# Patient Record
Sex: Male | Born: 2000 | Race: White | Hispanic: No | Marital: Single | Smoking: Current every day smoker
Health system: Southern US, Community
[De-identification: ages and names within clinical notes are randomized; demographics above are authoritative.]

## PROBLEM LIST (undated history)

## (undated) HISTORY — PX: TONSILLECTOMY: SUR1361

---
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
## 2022-03-08 DIAGNOSIS — J0391 Acute recurrent tonsillitis, unspecified: Secondary | ICD-10-CM | POA: Insufficient documentation

## 2022-03-16 ENCOUNTER — Other Ambulatory Visit (HOSPITAL_COMMUNITY): Payer: Self-pay

## 2022-03-16 MED ORDER — HYDROCODONE-ACETAMINOPHEN 7.5-325 MG/15ML PO SOLN
15.0000 mL | Freq: Four times a day (QID) | ORAL | 0 refills | Status: DC | PRN
  Filled 2022-03-16: qty 420, 7d supply, fill #0

## 2022-03-24 ENCOUNTER — Emergency Department (EMERGENCY_DEPARTMENT_HOSPITAL): Payer: Medicaid Other | Admitting: Anesthesiology

## 2022-03-24 ENCOUNTER — Other Ambulatory Visit: Payer: Self-pay

## 2022-03-24 ENCOUNTER — Ambulatory Visit: Payer: Self-pay | Admitting: Otolaryngology

## 2022-03-24 ENCOUNTER — Emergency Department (HOSPITAL_COMMUNITY): Payer: Medicaid Other | Admitting: Anesthesiology

## 2022-03-24 ENCOUNTER — Encounter (HOSPITAL_COMMUNITY): Payer: Self-pay

## 2022-03-24 ENCOUNTER — Encounter (HOSPITAL_COMMUNITY)
Admission: EM | Disposition: A | Discharge status: Home / Self Care | Payer: Self-pay | Source: Home / Self Care | Attending: Emergency Medicine

## 2022-03-24 ENCOUNTER — Ambulatory Visit (HOSPITAL_COMMUNITY)
Admission: EM | Admit: 2022-03-24 | Discharge: 2022-03-24 | Disposition: A | Discharge status: Home / Self Care | Payer: Medicaid Other | Attending: Emergency Medicine | Admitting: Emergency Medicine

## 2022-03-24 DIAGNOSIS — J95830 Postprocedural hemorrhage and hematoma of a respiratory system organ or structure following a respiratory system procedure: Secondary | ICD-10-CM

## 2022-03-24 HISTORY — PX: TONSILLECTOMY: SHX5217

## 2022-03-24 LAB — CBC WITH DIFFERENTIAL/PLATELET
Abs Immature Granulocytes: 0.01 10*3/uL (ref 0.00–0.07)
Basophils Absolute: 0 10*3/uL (ref 0.0–0.1)
Basophils Relative: 1 %
Eosinophils Absolute: 0.6 10*3/uL — ABNORMAL HIGH (ref 0.0–0.5)
Eosinophils Relative: 9 %
HCT: 42.2 % (ref 39.0–52.0)
Hemoglobin: 14.3 g/dL (ref 13.0–17.0)
Immature Granulocytes: 0 %
Lymphocytes Relative: 32 %
Lymphs Abs: 2.1 10*3/uL (ref 0.7–4.0)
MCH: 28.8 pg (ref 26.0–34.0)
MCHC: 33.9 g/dL (ref 30.0–36.0)
MCV: 84.9 fL (ref 80.0–100.0)
Monocytes Absolute: 0.5 10*3/uL (ref 0.1–1.0)
Monocytes Relative: 8 %
Neutro Abs: 3.4 10*3/uL (ref 1.7–7.7)
Neutrophils Relative %: 50 %
Platelets: 173 10*3/uL (ref 150–400)
RBC: 4.97 MIL/uL (ref 4.22–5.81)
RDW: 12 % (ref 11.5–15.5)
WBC: 6.7 10*3/uL (ref 4.0–10.5)
nRBC: 0 % (ref 0.0–0.2)

## 2022-03-24 LAB — BASIC METABOLIC PANEL
Anion gap: 8 (ref 5–15)
BUN: 19 mg/dL (ref 6–20)
CO2: 28 mmol/L (ref 22–32)
Calcium: 8.6 mg/dL — ABNORMAL LOW (ref 8.9–10.3)
Chloride: 101 mmol/L (ref 98–111)
Creatinine, Ser: 1.02 mg/dL (ref 0.61–1.24)
GFR, Estimated: >60 mL/min (ref >60)
Glucose, Bld: 96 mg/dL (ref 70–99)
Potassium: 3.8 mmol/L (ref 3.5–5.1)
Sodium: 137 mmol/L (ref 135–145)

## 2022-03-24 SURGERY — TONSILLECTOMY
Anesthesia: General

## 2022-03-24 MED ORDER — ORAL CARE MOUTH RINSE: 15.0000 mL | Freq: Once | OROMUCOSAL | Status: DC

## 2022-03-24 MED ORDER — SUCCINYLCHOLINE CHLORIDE 200 MG/10ML IV SOSY
PREFILLED_SYRINGE | INTRAVENOUS | Status: DC | PRN
  Administered 2022-03-24: 60 mg via INTRAVENOUS

## 2022-03-24 MED ORDER — OXYCODONE HCL 5 MG PO TABS: 5.0000 mg | ORAL_TABLET | Freq: Once | ORAL | Status: DC | PRN

## 2022-03-24 MED ORDER — SODIUM CHLORIDE 0.9 % IR SOLN
Status: DC | PRN
  Administered 2022-03-24: 1000 mL

## 2022-03-24 MED ORDER — FENTANYL CITRATE (PF) 250 MCG/5ML IJ SOLN
INTRAMUSCULAR | Status: DC | PRN
  Administered 2022-03-24: 50 ug via INTRAVENOUS

## 2022-03-24 MED ORDER — FENTANYL CITRATE (PF) 100 MCG/2ML IJ SOLN: 25.0000 ug | INTRAMUSCULAR | Status: DC | PRN

## 2022-03-24 MED ORDER — LACTATED RINGERS IV SOLN: INTRAVENOUS | Status: DC

## 2022-03-24 MED ORDER — MIDAZOLAM HCL 2 MG/2ML IJ SOLN
INTRAMUSCULAR | Status: AC
  Filled 2022-03-24: qty 2

## 2022-03-24 MED ORDER — PROMETHAZINE HCL 25 MG/ML IJ SOLN: 6.2500 mg | INTRAMUSCULAR | Status: DC | PRN

## 2022-03-24 MED ORDER — PROPOFOL 10 MG/ML IV BOLUS
INTRAVENOUS | Status: DC | PRN
  Administered 2022-03-24: 150 mg via INTRAVENOUS

## 2022-03-24 MED ORDER — MIDAZOLAM HCL 5 MG/5ML IJ SOLN
INTRAMUSCULAR | Status: DC | PRN
  Administered 2022-03-24: 2 mg via INTRAVENOUS

## 2022-03-24 MED ORDER — TRANEXAMIC ACID FOR EPISTAXIS
500.0000 mg | Freq: Once | TOPICAL | Status: AC
  Administered 2022-03-24: 500 mg via TOPICAL
  Filled 2022-03-24: qty 10

## 2022-03-24 MED ORDER — DEXAMETHASONE SODIUM PHOSPHATE 10 MG/ML IJ SOLN
INTRAMUSCULAR | Status: AC
  Filled 2022-03-24: qty 1

## 2022-03-24 MED ORDER — AMISULPRIDE (ANTIEMETIC) 5 MG/2ML IV SOLN: 10.0000 mg | Freq: Once | INTRAVENOUS | Status: DC | PRN

## 2022-03-24 MED ORDER — ONDANSETRON HCL 4 MG/2ML IJ SOLN
INTRAMUSCULAR | Status: AC
  Filled 2022-03-24: qty 2

## 2022-03-24 MED ORDER — LACTATED RINGERS IV BOLUS
1000.0000 mL | Freq: Once | INTRAVENOUS | Status: AC
  Administered 2022-03-24: 1000 mL via INTRAVENOUS

## 2022-03-24 MED ORDER — SUCCINYLCHOLINE CHLORIDE 200 MG/10ML IV SOSY
PREFILLED_SYRINGE | INTRAVENOUS | Status: AC
  Filled 2022-03-24: qty 10

## 2022-03-24 MED ORDER — OXYCODONE HCL 5 MG/5ML PO SOLN: 5.0000 mg | Freq: Once | ORAL | Status: DC | PRN

## 2022-03-24 MED ORDER — ACETAMINOPHEN 10 MG/ML IV SOLN: 1000.0000 mg | Freq: Once | INTRAVENOUS | Status: DC | PRN

## 2022-03-24 MED ORDER — OXYMETAZOLINE HCL 0.05 % NA SOLN
NASAL | Status: AC
  Filled 2022-03-24: qty 30

## 2022-03-24 MED ORDER — LIDOCAINE 2% (20 MG/ML) 5 ML SYRINGE
INTRAMUSCULAR | Status: DC | PRN
  Administered 2022-03-24: 50 mg via INTRAVENOUS

## 2022-03-24 MED ORDER — PROPOFOL 10 MG/ML IV BOLUS
INTRAVENOUS | Status: AC
  Filled 2022-03-24: qty 20

## 2022-03-24 MED ORDER — FENTANYL CITRATE (PF) 250 MCG/5ML IJ SOLN
INTRAMUSCULAR | Status: AC
  Filled 2022-03-24: qty 5

## 2022-03-24 MED ORDER — CHLORHEXIDINE GLUCONATE 0.12 % MT SOLN: 15.0000 mL | Freq: Once | OROMUCOSAL | Status: DC

## 2022-03-24 MED ORDER — ONDANSETRON HCL 4 MG/2ML IJ SOLN
INTRAMUSCULAR | Status: DC | PRN
  Administered 2022-03-24: 4 mg via INTRAVENOUS

## 2022-03-24 MED ORDER — DEXAMETHASONE SODIUM PHOSPHATE 10 MG/ML IJ SOLN
INTRAMUSCULAR | Status: DC | PRN
  Administered 2022-03-24: 10 mg via INTRAVENOUS

## 2022-03-24 SURGICAL SUPPLY — 34 items
BAG COUNTER SPONGE SURGICOUNT (BAG) ×1 IMPLANT
BLADE SURG 15 STRL LF DISP TIS (BLADE) IMPLANT
BLADE SURG 15 STRL SS (BLADE)
CANISTER SUCT 3000ML PPV (MISCELLANEOUS) ×1 IMPLANT
CATH ROBINSON RED A/P 10FR (CATHETERS) ×1 IMPLANT
CLEANER TIP ELECTROSURG 2X2 (MISCELLANEOUS) ×1 IMPLANT
COAGULATOR SUCT SWTCH 10FR 6 (ELECTROSURGICAL) ×1 IMPLANT
DRAPE HALF SHEET 40X57 (DRAPES) IMPLANT
ELECT COATED BLADE 2.86 ST (ELECTRODE) ×1 IMPLANT
ELECT REM PT RETURN 9FT ADLT (ELECTROSURGICAL)
ELECT REM PT RETURN 9FT PED (ELECTROSURGICAL)
ELECTRODE REM PT RETRN 9FT PED (ELECTROSURGICAL) IMPLANT
ELECTRODE REM PT RTRN 9FT ADLT (ELECTROSURGICAL) IMPLANT
GAUZE 4X4 16PLY ~~LOC~~+RFID DBL (SPONGE) ×1 IMPLANT
GLOVE ECLIPSE 7.5 STRL STRAW (GLOVE) ×1 IMPLANT
GOWN STRL REUS W/ TWL LRG LVL3 (GOWN DISPOSABLE) ×2 IMPLANT
GOWN STRL REUS W/TWL LRG LVL3 (GOWN DISPOSABLE) ×2
KIT BASIN OR (CUSTOM PROCEDURE TRAY) ×1 IMPLANT
KIT TURNOVER KIT B (KITS) ×1 IMPLANT
NS IRRIG 1000ML POUR BTL (IV SOLUTION) ×1 IMPLANT
PACK BASIC III (CUSTOM PROCEDURE TRAY) ×1
PACK SRG BSC III STRL LF ECLPS (CUSTOM PROCEDURE TRAY) ×1 IMPLANT
PAD ARMBOARD 7.5X6 YLW CONV (MISCELLANEOUS) ×2 IMPLANT
PENCIL FOOT CONTROL (ELECTRODE) ×1 IMPLANT
SPECIMEN JAR SMALL (MISCELLANEOUS) ×2 IMPLANT
SPONGE TONSIL 1 RF SGL (DISPOSABLE) ×1 IMPLANT
SYR BULB EAR ULCER 3OZ GRN STR (SYRINGE) ×1 IMPLANT
TOWEL GREEN STERILE FF (TOWEL DISPOSABLE) ×2 IMPLANT
TUBE CONNECTING 12X1/4 (SUCTIONS) ×1 IMPLANT
TUBE SALEM SUMP 10F W/ARV (TUBING) IMPLANT
TUBE SALEM SUMP 12R W/ARV (TUBING) IMPLANT
TUBE SALEM SUMP 14F W/ARV (TUBING) IMPLANT
TUBE SALEM SUMP 16 FR W/ARV (TUBING) IMPLANT
WATER STERILE IRR 1000ML POUR (IV SOLUTION) ×1 IMPLANT

## 2022-03-24 NOTE — ED Notes (Signed)
[facility] arrived to transport patient to [facility], VS stable

## 2022-03-24 NOTE — ED Provider Notes (Signed)
[facility] Provider Note   CSN: [id number] Arrival date & time: 03/24/22  0542     History  Chief Complaint  Patient presents with   Post-op Problem    Joseph Burke is a 22 y.o. male.  Patient presents to the emergency department for evaluation of bleeding from recent tonsillectomy.  Has had persistent bleeding since the surgery last week.  Reports that he has been told to drink ice water and at times it makes it slow down but then the bleeding starts again.       Home Medications Prior to Admission medications   Medication Sig Start Date End Date Taking? Authorizing Provider  naproxen (NAPROSYN) 500 MG tablet Take 1 tablet (500 mg total) by mouth 2 (two) times daily with a meal. 03/16/21   Jaynee Eagles, PA-C  ondansetron (ZOFRAN ODT) 4 MG disintegrating tablet Take 1 tablet (4 mg total) by mouth every 8 (eight) hours as needed for nausea or vomiting. 09/01/18   Henderly, Britni A, PA-C  phenol (CHLORASEPTIC) 1.4 % LIQD Use as directed 1 spray in the mouth or throat 3 (three) times daily as needed for throat irritation / pain. 03/16/21   Jaynee Eagles, PA-C      Allergies    Patient has no known allergies.    Review of Systems   Review of Systems  Physical Exam Updated Vital Signs BP 122/79 (BP Location: Left Arm)   Pulse 70   Temp 97.9 F (36.6 C) (Oral)   Resp 16   Ht '5\' 8"'$  (1.727 m)   Wt 56.7 kg   SpO2 100%   BMI 19.01 kg/m  Physical Exam Vitals and nursing note reviewed.  Constitutional:      General: He is not in acute distress.    Appearance: He is well-developed.  HENT:     Head: Normocephalic and atraumatic.     Mouth/Throat:     Mouth: Mucous membranes are moist.     Comments: Blood and clots in patient's mouth, bleeding from tonsillectomy bed Eyes:     General: Vision grossly intact. Gaze aligned appropriately.     Extraocular Movements: Extraocular movements intact.     Conjunctiva/sclera: Conjunctivae  normal.  Cardiovascular:     Rate and Rhythm: Normal rate and regular rhythm.     Pulses: Normal pulses.     Heart sounds: Normal heart sounds, S1 normal and S2 normal. No murmur heard.    No friction rub. No gallop.  Pulmonary:     Effort: Pulmonary effort is normal. No respiratory distress.     Breath sounds: Normal breath sounds.  Abdominal:     Palpations: Abdomen is soft.     Tenderness: There is no abdominal tenderness. There is no guarding or rebound.     Hernia: No hernia is present.  Musculoskeletal:        General: No swelling.     Cervical back: Full passive range of motion without pain, normal range of motion and neck supple. No pain with movement, spinous process tenderness or muscular tenderness. Normal range of motion.     Right lower leg: No edema.     Left lower leg: No edema.  Skin:    General: Skin is warm and dry.     Capillary Refill: Capillary refill takes less than 2 seconds.     Findings: No ecchymosis, erythema, lesion or wound.  Neurological:     Mental Status: He is alert and oriented to  person, place, and time.     GCS: GCS eye subscore is 4. GCS verbal subscore is 5. GCS motor subscore is 6.     Cranial Nerves: Cranial nerves 2-12 are intact.     Sensory: Sensation is intact.     Motor: Motor function is intact. No weakness or abnormal muscle tone.     Coordination: Coordination is intact.  Psychiatric:        Mood and Affect: Mood normal.        Speech: Speech normal.        Behavior: Behavior normal.     ED Results / Procedures / Treatments   Labs (all labs ordered are listed, but only abnormal results are displayed) Labs Reviewed  CBC WITH DIFFERENTIAL/PLATELET - Abnormal; Notable for the following components:      Result Value   Eosinophils Absolute 0.6 (*)    All other components within normal limits  BASIC METABOLIC PANEL - Abnormal; Notable for the following components:   Calcium 8.6 (*)    All other components within normal limits     EKG None  Radiology No results found.  Procedures Procedures    Medications Ordered in ED Medications  lactated ringers bolus 1,000 mL (1,000 mLs Intravenous New Bag/Given 03/24/22 0608)  tranexamic acid (CYKLOKAPRON) 1000 MG/10ML topical solution 500 mg (500 mg Topical Given 03/24/22 [id number])    ED Course/ Medical Decision Making/ A&P                             Medical Decision Making Amount and/or Complexity of Data Reviewed Labs: ordered.   Patient presents with post tonsillectomy bleeding.  It sounds like this has been on and off for days.  He is breathing without difficulty.  Hemoglobin is stable.  Attempted nebulized tranexamic acid.  Patient had difficulty because he continually needed to spit out the blood.  Contacted Dr. Constance Holster, on-call for ENT at [facility] [place].  Patient to be transferred to the ED at [facility] for him to evaluate and likely take to the OR.        Final Clinical Impression(s) / ED Diagnoses Final diagnoses:  Hemorrhage following tonsillectomy    Rx / DC Orders ED Discharge Orders     None         Orpah Greek, MD 03/24/22 [phone]

## 2022-03-24 NOTE — ED Triage Notes (Signed)
Pt arrived via [facility] from home c/o bleeding from his throat following recent tonsillectomy. Pt reports he was also recently exposed to Strep Throat. Pt actively spitting out bloody mucus. Pt reports going to other facilities this week to get treatment and reports nothing has helped. EDP at bedside during Triage.

## 2022-03-24 NOTE — Anesthesia Postprocedure Evaluation (Signed)
Anesthesia Post Note  Patient: [place] Klus  Procedure(s) Performed: POST OP TONSILLECTOMY     Patient location during evaluation: PACU Anesthesia Type: General Level of consciousness: awake Pain management: pain level controlled Vital Signs Assessment: post-procedure vital signs reviewed and stable Respiratory status: spontaneous breathing, nonlabored ventilation and respiratory function stable Cardiovascular status: blood pressure returned to baseline and stable Postop Assessment: no apparent nausea or vomiting Anesthetic complications: no   No notable events documented.  Last Vitals:  Vitals:   03/24/22 1315 03/24/22 1330  BP: 104/64 106/64  Pulse: (!) 48 (!) 52  Resp: 12 13  Temp:  36.8 C  SpO2: 98% 98%    Last Pain:  Vitals:   03/24/22 1330  TempSrc:   PainSc: 0-No pain                 Branda Chaudhary P Nikolas Casher

## 2022-03-24 NOTE — Anesthesia Preprocedure Evaluation (Signed)
Anesthesia Evaluation  Patient identified by MRN, date of birth, ID band Patient awake    Reviewed: Allergy & Precautions, NPO status , Patient's Chart, lab work & pertinent test results  Airway Mallampati: III  TM Distance: >3 FB Neck ROM: Full  Mouth opening: Limited Mouth Opening  Dental no notable dental hx.    Pulmonary neg pulmonary ROS   Pulmonary exam normal        Cardiovascular negative cardio ROS Normal cardiovascular exam     Neuro/Psych negative neurological ROS  negative psych ROS   GI/Hepatic negative GI ROS, Neg liver ROS,,,  Endo/Other  negative endocrine ROS    Renal/GU negative Renal ROS     Musculoskeletal negative musculoskeletal ROS (+)    Abdominal   Peds  Hematology negative hematology ROS (+)   Anesthesia Other Findings  Post tonsillectomy bleed  Reproductive/Obstetrics                             Anesthesia Physical Anesthesia Plan  ASA: 1  Anesthesia Plan: General   Post-op Pain Management:    Induction: Intravenous  PONV Risk Score and Plan: 2 and Ondansetron, Dexamethasone, Midazolam and Treatment may vary due to age or medical condition  Airway Management Planned: Oral ETT and Video Laryngoscope Planned  Additional Equipment:   Intra-op Plan:   Post-operative Plan: Extubation in OR  Informed Consent: I have reviewed the patients History and Physical, chart, labs and discussed the procedure including the risks, benefits and alternatives for the proposed anesthesia with the patient or authorized representative who has indicated his/her understanding and acceptance.     Dental advisory given  Plan Discussed with: CRNA  Anesthesia Plan Comments:        Anesthesia Quick Evaluation

## 2022-03-24 NOTE — H&P (Signed)
Joseph Burke is an 22 y.o. male.   Chief Complaint: Post tonsillectomy bleeding HPI: History of tonsillectomy 8 days ago at the outpatient center by Dr. Redmond Baseman.  On day 5 he started bleeding, and has had multiple episodes of bleeding since then.  He has been to an urgent care, and several other outlying facilities.  He returned again this morning to [facility].  Recommended transfer down to [facility] for surgical intervention.  Otherwise in good health.  Currently not bleeding.  He feels it is coming from the left side.  No past medical history on file.  Past Surgical History:  Procedure Laterality Date   TONSILLECTOMY      No family history on file. Social History:  reports that he has never smoked. He has been exposed to tobacco smoke. He has never used smokeless tobacco. He reports that he does not drink alcohol and does not use drugs.  Allergies: No Known Allergies  (Not in a hospital admission)   Results for orders placed or performed during the hospital encounter of 03/24/22 (from the past 48 hour(s))  CBC with Differential/Platelet     Status: Abnormal   Collection Time: 03/24/22  6:04 AM  Result Value Ref Range   WBC 6.7 4.0 - 10.5 K/uL   RBC 4.97 4.22 - 5.81 MIL/uL   Hemoglobin 14.3 13.0 - 17.0 g/dL   HCT 42.2 39.0 - 52.0 %   MCV 84.9 80.0 - 100.0 fL   MCH 28.8 26.0 - 34.0 pg   MCHC 33.9 30.0 - 36.0 g/dL   RDW 12.0 11.5 - 15.5 %   Platelets 173 150 - 400 K/uL   nRBC 0.0 0.0 - 0.2 %   Neutrophils Relative % 50 %   Neutro Abs 3.4 1.7 - 7.7 K/uL   Lymphocytes Relative 32 %   Lymphs Abs 2.1 0.7 - 4.0 K/uL   Monocytes Relative 8 %   Monocytes Absolute 0.5 0.1 - 1.0 K/uL   Eosinophils Relative 9 %   Eosinophils Absolute 0.6 (H) 0.0 - 0.5 K/uL   Basophils Relative 1 %   Basophils Absolute 0.0 0.0 - 0.1 K/uL   Immature Granulocytes 0 %   Abs Immature Granulocytes 0.01 0.00 - 0.07 K/uL    Comment: Performed at [facility], [address] [place], [place]  [id number]  Basic metabolic panel     Status: Abnormal   Collection Time: 03/24/22  6:04 AM  Result Value Ref Range   Sodium 137 135 - 145 mmol/L   Potassium 3.8 3.5 - 5.1 mmol/L   Chloride 101 98 - 111 mmol/L   CO2 28 22 - 32 mmol/L   Glucose, Bld 96 70 - 99 mg/dL    Comment: Glucose reference range applies only to samples taken after fasting for at least 8 hours.   BUN 19 6 - 20 mg/dL   Creatinine, Ser 1.02 0.61 - 1.24 mg/dL   Calcium 8.6 (L) 8.9 - 10.3 mg/dL   GFR, Estimated >60 >60 mL/min    Comment: (NOTE) Calculated using the CKD-EPI Creatinine Equation (2021)    Anion gap 8 5 - 15    Comment: Performed at [facility], [address], [place],  [address]   No results found.  ROS: otherwise negative  There were no vitals taken for this visit.  PHYSICAL EXAM: Overall appearance:  Healthy appearing, in no distress.  Breathing and voice are clear. Head:  Normocephalic, atraumatic. Ears: External ears look healthy. Nose: External nose  is healthy in appearance. Internal nasal exam free of any lesions or obstruction. Oral Cavity/pharynx: Bilateral tonsillar fossae with normal eschar, blood clot attached to the left side no active bleeding. Neuro:  No identifiable neurologic deficits. Neck: No palpable neck masses.  Studies Reviewed: none    Assessment/Plan Post tonsillectomy bleeding, clot on the left side.  Recommend exam under anesthesia with cauterization.  Risks and benefits discussed with him and the mother.  Should be able to go home later today.  Izora Gala 03/24/2022, 10:33 AM

## 2022-03-24 NOTE — Anesthesia Procedure Notes (Signed)
Procedure Name: Intubation Date/Time: 03/24/2022 12:07 PM  Performed by: Kyung Rudd, CRNAPre-anesthesia Checklist: Patient identified, Emergency Drugs available, Suction available and Patient being monitored Patient Re-evaluated:Patient Re-evaluated prior to induction Oxygen Delivery Method: Circle system utilized Preoxygenation: Pre-oxygenation with 100% oxygen Induction Type: IV induction and Rapid sequence Ventilation: Mask ventilation without difficulty Laryngoscope Size: Mac and 4 Grade View: Grade I Tube type: Oral Tube size: 7.0 mm Number of attempts: 1 Airway Equipment and Method: Stylet Placement Confirmation: ETT inserted through vocal cords under direct vision, positive ETCO2 and breath sounds checked- equal and bilateral Secured at: 21 cm Tube secured with: Tape Dental Injury: Teeth and Oropharynx as per pre-operative assessment

## 2022-03-24 NOTE — ED Provider Notes (Signed)
Patient seen on arrival as a transfer from our affiliated center.  I discussed his transport with critical care staff.  They note no hemodynamic instability in transport, patient was self suctioning, spitting up blood. The patient denies new complaints, pain, we discussed his recent history, and on exam he has evidence for clot left greater than right. ENT paged   Joseph Muskrat, MD 03/24/22 [phone]

## 2022-03-24 NOTE — Op Note (Signed)
03/24/2022  12:23 PM  PATIENT:  [place]  22 y.o. male  PRE-OPERATIVE DIAGNOSIS:  Post tonsillectomy hemorrhage  POST-OPERATIVE DIAGNOSIS:  Post tonsillectomy hemorrhage  PROCEDURE:  Procedure(s): Treatment of POST OP TONSILLECTOMY hemorrhage  SURGEON:  Surgeon(s): Izora Gala, MD  ANESTHESIA:   General  COUNTS: Correct   DICTATION: The patient was taken to the operating room and placed on the operating table in the supine position. Following induction of general endotracheal anesthesia, the table was turned and the patient was draped in a standard fashion. A Crowe-Davis mouthgag was inserted into the oral cavity and used to retract the tongue and mandible, then attached to the Mayo stand.  The pharynx was inspected and blood clot was adherent to the left tonsillar fossa.  This was all cleaned up.  There was extensive granulation tissue around the tonsillar fossa.  There was a main bleeding site in the midportion of the tonsillar fossa that was cauterized with suction cautery.  There were multiple other small areas of granulation tissue with mild bleeding that were similarly treated.  There was no bleeding on the right side.  The oral cavity and pharynx was irrigated with saline and suctioned..  An oral gastric tube was used to aspirate the contents of the stomach. The patient was then awakened from anesthesia and transferred to PACU in stable condition.   PATIENT DISPOSITION:  To PACU, stable

## 2022-03-24 NOTE — Transfer of Care (Signed)
Immediate Anesthesia Transfer of Care Note  Patient: Joseph Burke  Procedure(s) Performed: POST OP TONSILLECTOMY  Patient Location: PACU  Anesthesia Type:General  Level of Consciousness: awake, alert , and drowsy  Airway & Oxygen Therapy: Patient Spontanous Breathing  Post-op Assessment: Report given to RN, Post -op Vital signs reviewed and stable, and Patient moving all extremities X 4  Post vital signs: Reviewed and stable  Last Vitals:  Vitals Value Taken Time  BP 118/66 03/24/22 1240  Temp    Pulse 51 03/24/22 1245  Resp 15 03/24/22 1245  SpO2 99 % 03/24/22 1245  Vitals shown include unvalidated device data.  Last Pain:  Vitals:   03/24/22 1119  TempSrc: Oral  PainSc: 3          Complications: No notable events documented.

## 2022-03-24 NOTE — Discharge Instructions (Signed)
Resume diet, stick with soft foods for the next several days.  Stay well-hydrated.

## 2022-03-24 NOTE — ED Triage Notes (Signed)
Pt BIB [facility] from home c/o continued tonsil bleeding since having his tonsils removed 1 week ago.  Pt states he was seen at 2 other EDs recently for the same.

## 2022-03-25 ENCOUNTER — Encounter (HOSPITAL_COMMUNITY): Payer: Self-pay | Admitting: Otolaryngology

## 2023-06-26 ENCOUNTER — Emergency Department (HOSPITAL_COMMUNITY)
Admission: EM | Admit: 2023-06-26 | Discharge: 2023-06-26 | Disposition: A | Discharge status: Home / Self Care | Attending: Emergency Medicine | Admitting: Emergency Medicine

## 2023-06-26 ENCOUNTER — Encounter (HOSPITAL_COMMUNITY): Payer: Self-pay

## 2023-06-26 ENCOUNTER — Other Ambulatory Visit: Payer: Self-pay

## 2023-06-26 ENCOUNTER — Emergency Department (HOSPITAL_COMMUNITY)

## 2023-06-26 DIAGNOSIS — R0602 Shortness of breath: Secondary | ICD-10-CM | POA: Diagnosis not present

## 2023-06-26 DIAGNOSIS — R0789 Other chest pain: Secondary | ICD-10-CM | POA: Insufficient documentation

## 2023-06-26 LAB — CBC WITH DIFFERENTIAL/PLATELET
Abs Immature Granulocytes: 0.02 10*3/uL (ref 0.00–0.07)
Basophils Absolute: 0 10*3/uL (ref 0.0–0.1)
Basophils Relative: 1 %
Eosinophils Absolute: 0.4 10*3/uL (ref 0.0–0.5)
Eosinophils Relative: 5 %
HCT: 44.8 % (ref 39.0–52.0)
Hemoglobin: 15.7 g/dL (ref 13.0–17.0)
Immature Granulocytes: 0 %
Lymphocytes Relative: 28 %
Lymphs Abs: 1.8 10*3/uL (ref 0.7–4.0)
MCH: 29.5 pg (ref 26.0–34.0)
MCHC: 35 g/dL (ref 30.0–36.0)
MCV: 84.1 fL (ref 80.0–100.0)
Monocytes Absolute: 0.3 10*3/uL (ref 0.1–1.0)
Monocytes Relative: 5 %
Neutro Abs: 3.9 10*3/uL (ref 1.7–7.7)
Neutrophils Relative %: 61 %
Platelets: 195 10*3/uL (ref 150–400)
RBC: 5.33 MIL/uL (ref 4.22–5.81)
RDW: 12.4 % (ref 11.5–15.5)
WBC: 6.5 10*3/uL (ref 4.0–10.5)
nRBC: 0 % (ref 0.0–0.2)

## 2023-06-26 LAB — COMPREHENSIVE METABOLIC PANEL WITH GFR
ALT: 20 U/L (ref 0–44)
AST: 21 U/L (ref 15–41)
Albumin: 4.7 g/dL (ref 3.5–5.0)
Alkaline Phosphatase: 57 U/L (ref 38–126)
Anion gap: 7 (ref 5–15)
BUN: 17 mg/dL (ref 6–20)
CO2: 26 mmol/L (ref 22–32)
Calcium: 9.4 mg/dL (ref 8.9–10.3)
Chloride: 102 mmol/L (ref 98–111)
Creatinine, Ser: 1.05 mg/dL (ref 0.61–1.24)
GFR, Estimated: >60 mL/min (ref >60)
Glucose, Bld: 85 mg/dL (ref 70–99)
Potassium: 3.3 mmol/L — ABNORMAL LOW (ref 3.5–5.1)
Sodium: 135 mmol/L (ref 135–145)
Total Bilirubin: 1.1 mg/dL (ref 0.0–1.2)
Total Protein: 8.1 g/dL (ref 6.5–8.1)

## 2023-06-26 LAB — TROPONIN I (HIGH SENSITIVITY): Troponin I (High Sensitivity): <2 ng/L (ref <18)

## 2023-06-26 MED ORDER — IBUPROFEN 400 MG PO TABS
600.0000 mg | ORAL_TABLET | Freq: Once | ORAL | Status: AC
  Administered 2023-06-26: 600 mg via ORAL
  Filled 2023-06-26: qty 2

## 2023-06-26 NOTE — ED Triage Notes (Signed)
 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

## 2023-06-26 NOTE — ED Provider Notes (Signed)
 [place] EMERGENCY DEPARTMENT AT [facility] Provider Note   CSN: [id number] Arrival date & time: 06/26/23  1640     History  Chief Complaint  Patient presents with   Chest Pain    Joseph Burke is a 23 y.o. male.  Patient to ED for evaluation of chest pain that started earlier today in the left chest. He felt a "hot flash" come over him, associated sharp pain and SOB. He is a Psychologist, occupational and was welding at the time of onset. Symptoms would come and go regularly for about 2 hours, then become more and more infrequent. He reports similar symptoms in the past that were not evaluated. No recent fever, cough. No nausea or vomiting.   The history is provided by the patient. No language interpreter was used.  Chest Pain      Home Medications Prior to Admission medications   Medication Sig Start Date End Date Taking? Authorizing Provider  HYDROcodone -acetaminophen  (HYCET) 7.5-325 mg/15 ml solution Take 15 mLs by mouth every 6 (six) hours as needed for moderate pain. 03/16/22   [provider]  moxifloxacin (VIGAMOX) 0.5 % ophthalmic solution Place 1 drop into the left eye 3 (three) times daily. 05/22/23   [provider]  naproxen  (NAPROSYN ) 500 MG tablet Take 1 tablet (500 mg total) by mouth 2 (two) times daily with a meal. Patient not taking: Reported on 03/24/2022 03/16/21   Adolph Hoop, PA-C  ondansetron  (ZOFRAN  ODT) 4 MG disintegrating tablet Take 1 tablet (4 mg total) by mouth every 8 (eight) hours as needed for nausea or vomiting. Patient not taking: Reported on 03/24/2022 09/01/18   Henderly, Britni A, PA-C  phenol (CHLORASEPTIC) 1.4 % LIQD Use as directed 1 spray in the mouth or throat 3 (three) times daily as needed for throat irritation / pain. Patient not taking: Reported on 03/24/2022 03/16/21   Adolph Hoop, PA-C  prednisoLONE acetate (PRED FORTE) 1 % ophthalmic suspension Place 1 drop into the left eye 4 (four) times daily. 05/27/23   [provider]       Allergies    Patient has no known allergies.    Review of Systems   Review of Systems  Cardiovascular:  Positive for chest pain.    Physical Exam Updated Vital Signs BP (!) 146/82 (BP Location: Right Arm)   Pulse 67   Temp 98 F (36.7 C)   Resp 15   Ht 5\' 8"  (1.727 m)   Wt 61.2 kg   SpO2 100%   BMI 20.53 kg/m  Physical Exam Constitutional:      Appearance: He is well-developed.  HENT:     Head: Normocephalic.  Cardiovascular:     Rate and Rhythm: Normal rate and regular rhythm.     Heart sounds: No murmur heard. Pulmonary:     Effort: Pulmonary effort is normal.     Breath sounds: Normal breath sounds. No wheezing, rhonchi or rales.  Chest:     Chest wall: Tenderness present.       Comments: Chest reproducibly tender to left chest. No swelling. Abdominal:     General: Bowel sounds are normal.     Palpations: Abdomen is soft.     Tenderness: There is no abdominal tenderness. There is no guarding or rebound.  Musculoskeletal:        General: Normal range of motion.     Cervical back: Normal range of motion and neck supple.  Skin:    General: Skin is warm and dry.  Neurological:     General: No focal deficit present.     Mental Status: He is alert and oriented to person, place, and time.     ED Results / Procedures / Treatments   Labs (all labs ordered are listed, but only abnormal results are displayed) Labs Reviewed  COMPREHENSIVE METABOLIC PANEL WITH GFR - Abnormal; Notable for the following components:      Result Value   Potassium 3.3 (*)    All other components within normal limits  CBC WITH DIFFERENTIAL/PLATELET  TROPONIN I (HIGH SENSITIVITY)    EKG EKG Interpretation Date/Time:  Wednesday June 26 2023 16:48:33 EDT Ventricular Rate:  62 PR Interval:  168 QRS Duration:  88 QT Interval:  388 QTC Calculation: 393 R Axis:   88  Text Interpretation: Normal sinus rhythm Right atrial enlargement Early repolarization Borderline ECG No previous  ECGs available Confirmed by Sueellen Emery [phone]) on 06/26/2023 7:10:22 PM  Radiology DG Chest 2 View Result Date: 06/26/2023 CLINICAL DATA:  Chest pain. EXAM: CHEST - 2 VIEW COMPARISON:  09/01/2018 FINDINGS: The cardiomediastinal contours are normal. The lungs are clear. Pulmonary vasculature is normal. No consolidation, pleural effusion, or pneumothorax. No acute osseous abnormalities are seen. IMPRESSION: Negative radiographs of the chest. Electronically Signed   By: Chadwick Colonel M.D.   On: 06/26/2023 17:29    Procedures Procedures    Medications Ordered in ED Medications  ibuprofen (ADVIL) tablet 600 mg (has no administration in time range)    ED Course/ Medical Decision Making/ A&P Clinical Course as of 06/26/23 2013  Wed Jun 26, 2023  2011 Patient with chest pain as described in the HPI. EKG reviewed with Dr. Scarlette Currier - no acute changes. CXR clear. Troponin negative at <2. No concerning lab abnormalities. Mild hypokalemia of 3.3. Will supplement.  [SU]    Clinical Course User Index [SU] Mandy Second, PA-C                                 Medical Decision Making Amount and/or Complexity of Data Reviewed Labs: ordered. Radiology: ordered.           Final Clinical Impression(s) / ED Diagnoses Final diagnoses:  Chest wall pain    Rx / DC Orders ED Discharge Orders     None         Rama Burkitt 06/26/23 2013    Sueellen Emery, MD 06/26/23 2345

## 2023-06-26 NOTE — Discharge Instructions (Signed)
 Recommend ibuprofen 600 mg (3 over-the-counter strength tablets) every 6 hours for pain. Follow up with your doctor for recheck if symptoms persist and return to the ED with any new or concerning symptoms at any time.
